# Patient Record
Sex: Male | Born: 1975 | Race: White | Hispanic: Yes | Marital: Married | Smoking: Current some day smoker
Health system: Southern US, Community
[De-identification: ages and names within clinical notes are randomized; demographics above are authoritative.]

---

## 2008-09-13 ENCOUNTER — Emergency Department (HOSPITAL_COMMUNITY): Admission: EM | Admit: 2008-09-13 | Discharge: 2008-09-13 | Payer: Self-pay | Admitting: Emergency Medicine

## 2010-06-22 LAB — URINALYSIS, ROUTINE W REFLEX MICROSCOPIC
Bilirubin Urine: NEGATIVE
Ketones, ur: NEGATIVE mg/dL
Nitrite: NEGATIVE
Protein, ur: NEGATIVE mg/dL
Urobilinogen, UA: 0.2 mg/dL (ref 0.0–1.0)
pH: 5 (ref 5.0–8.0)

## 2010-06-22 LAB — URINE MICROSCOPIC-ADD ON

## 2016-09-05 ENCOUNTER — Emergency Department (HOSPITAL_COMMUNITY)
Admission: EM | Admit: 2016-09-05 | Discharge: 2016-09-05 | Disposition: A | Discharge status: Home / Self Care | Payer: Self-pay | Attending: Emergency Medicine | Admitting: Emergency Medicine

## 2016-09-05 DIAGNOSIS — H66012 Acute suppurative otitis media with spontaneous rupture of ear drum, left ear: Secondary | ICD-10-CM | POA: Insufficient documentation

## 2016-09-05 MED ORDER — AMOXICILLIN 500 MG PO CAPS: 500.0000 mg | ORAL_CAPSULE | Freq: Three times a day (TID) | ORAL | 0 refills | Status: DC

## 2016-09-05 MED ORDER — IBUPROFEN 600 MG PO TABS: 600.0000 mg | ORAL_TABLET | Freq: Three times a day (TID) | ORAL | 0 refills | Status: DC | PRN

## 2016-09-05 MED ORDER — ACETAMINOPHEN 325 MG PO TABS
650.0000 mg | ORAL_TABLET | Freq: Once | ORAL | Status: AC
  Administered 2016-09-05: 650 mg via ORAL
  Filled 2016-09-05: qty 2

## 2016-09-05 NOTE — ED Triage Notes (Signed)
Pain and drainage to left ear x 2 days

## 2016-09-05 NOTE — ED Provider Notes (Signed)
  [facility]-EMERGENCY DEPT Provider Note   CSN: [id number] Arrival date & time: 09/05/16  0141     History   Chief Complaint Chief Complaint  Patient presents with  . Otalgia    HPI Shawn Poole is a 41 y.o. male.  The history is provided by the patient. A language interpreter was used [phone]).  Otalgia  This is a new problem. The current episode started 2 days ago. There is pain in the left ear. The problem occurs constantly. The problem has been gradually worsening. There has been no fever. The pain is moderate. Associated symptoms include ear discharge. Pertinent negatives include no headaches.  denies trauma No h/ surgery to ears No swimming    PMH -none Home Medications    Prior to Admission medications   Medication Sig Start Date End Date Taking? Authorizing Provider  amoxicillin (AMOXIL) 500 MG capsule Take 1 capsule (500 mg total) by mouth 3 (three) times daily. 09/05/16   Zadie RhineWickline, Mirayah Wren, MD  ibuprofen (ADVIL,MOTRIN) 600 MG tablet Take 1 tablet (600 mg total) by mouth every 8 (eight) hours as needed for moderate pain. 09/05/16   Zadie RhineWickline, Karne Ozga, MD    Family History No family history on file.  Social History Social History  Substance Use Topics  . Smoking status: Not on file  . Smokeless tobacco: Not on file  . Alcohol use Not on file     Allergies   Patient has no known allergies.   Review of Systems Review of Systems  HENT: Positive for ear discharge and ear pain.   Neurological: Negative for headaches.     Physical Exam Updated Vital Signs BP 138/90   Pulse 70   Temp 97.8 F (36.6 C) (Oral)   Resp 16   Wt 94.3 kg (208 lb)   SpO2 98%   Physical Exam CONSTITUTIONAL: Well developed/well nourished HEAD: Normocephalic/atraumatic EYES: EOMI/PERRL ENMT: Mucous membranes moist, rgiht TM intact Left TM- ?perforation, fluid noted in canal.  No active bleeding noted NEURO: Pt is awake/alert/appropriate, moves all  extremitiesx4. EXTREMITIES:  full ROM SKIN: warm, color normal PSYCH: no abnormalities of mood noted, alert and oriented to situation   ED Treatments / Results  Labs (all labs ordered are listed, but only abnormal results are displayed) Labs Reviewed - No data to display  EKG  EKG Interpretation None       Radiology No results found.  Procedures Procedures (including critical care time)  Medications Ordered in ED Medications  acetaminophen (TYLENOL) tablet 650 mg (650 mg Oral Given 09/05/16 0412)     Initial Impression / Assessment and Plan / ED Course  I have reviewed the triage vital signs and the nursing notes.     Suspect OM with perforation Pt reports he had bleeding at first, now drainage Will start oral antibiotics He was referred to ENT  Final Clinical Impressions(s) / ED Diagnoses   Final diagnoses:  Acute suppurative otitis media of left ear with spontaneous rupture of tympanic membrane, recurrence not specified    New Prescriptions Discharge Medication List as of 09/05/2016  5:37 AM    START taking these medications   Details  amoxicillin (AMOXIL) 500 MG capsule Take 1 capsule (500 mg total) by mouth 3 (three) times daily., Starting Sat 09/05/2016, Print         Zadie RhineWickline, Jahking Lesser, MD 09/05/16 [phone]

## 2020-09-02 ENCOUNTER — Emergency Department (HOSPITAL_COMMUNITY)
Admission: EM | Admit: 2020-09-02 | Discharge: 2020-09-02 | Disposition: A | Discharge status: Home / Self Care | Payer: Self-pay | Attending: Emergency Medicine | Admitting: Emergency Medicine

## 2020-09-02 ENCOUNTER — Encounter (HOSPITAL_COMMUNITY): Payer: Self-pay | Admitting: *Deleted

## 2020-09-02 ENCOUNTER — Other Ambulatory Visit: Payer: Self-pay

## 2020-09-02 ENCOUNTER — Emergency Department (HOSPITAL_COMMUNITY): Payer: Self-pay

## 2020-09-02 DIAGNOSIS — S82832A Other fracture of upper and lower end of left fibula, initial encounter for closed fracture: Secondary | ICD-10-CM | POA: Insufficient documentation

## 2020-09-02 DIAGNOSIS — Y92009 Unspecified place in unspecified non-institutional (private) residence as the place of occurrence of the external cause: Secondary | ICD-10-CM | POA: Insufficient documentation

## 2020-09-02 DIAGNOSIS — W010XXA Fall on same level from slipping, tripping and stumbling without subsequent striking against object, initial encounter: Secondary | ICD-10-CM | POA: Insufficient documentation

## 2020-09-02 DIAGNOSIS — S8252XA Displaced fracture of medial malleolus of left tibia, initial encounter for closed fracture: Secondary | ICD-10-CM | POA: Insufficient documentation

## 2020-09-02 MED ORDER — OXYCODONE-ACETAMINOPHEN 5-325 MG PO TABS
1.0000 | ORAL_TABLET | Freq: Once | ORAL | Status: AC
  Administered 2020-09-02: 1 via ORAL
  Filled 2020-09-02: qty 1

## 2020-09-02 MED ORDER — HYDROCODONE-ACETAMINOPHEN 5-325 MG PO TABS: 1.0000 | ORAL_TABLET | Freq: Four times a day (QID) | ORAL | 0 refills | Status: DC | PRN

## 2020-09-02 NOTE — ED Notes (Signed)
Pt instructed how to use crutches using spanish interpreter; pt verbalized understanding and was able to demonstrate how to use properly; pt taken out via wheelchair

## 2020-09-02 NOTE — ED Notes (Signed)
Patient off unit to xray at this time

## 2020-09-02 NOTE — ED Provider Notes (Signed)
[facility] EMERGENCY DEPARTMENT Provider Note   CSN: [id number] Arrival date & time: 09/02/20  1544     History No chief complaint on file.   Shawn Poole is a 45 y.o. male who presents for evaluation of pain, swelling in the left lower extremity after mechanical fall that occurred approximately 3 days ago.  He reports he was at his home when he tripped and had an inversion injury of his left ankle.  Since then he has had pain, swelling, bruising to the area.  He reports difficulty ambulating or bearing weight.  He has been taking Motrin at home with minimal improvement.  He denies any numbness/weakness.  The history is provided by the patient.      History reviewed. No pertinent past medical history.  There are no problems to display for this patient.   History reviewed. No pertinent surgical history.     No family history on file.     Home Medications Prior to Admission medications   Medication Sig Start Date End Date Taking? Authorizing Provider  HYDROcodone-acetaminophen (NORCO/VICODIN) 5-325 MG tablet Take 1 tablet by mouth every 6 (six) hours as needed. 09/02/20  Yes Graciella Freer A, PA-C  amoxicillin (AMOXIL) 500 MG capsule Take 1 capsule (500 mg total) by mouth 3 (three) times daily. 09/05/16   Zadie Rhine, MD  ibuprofen (ADVIL,MOTRIN) 600 MG tablet Take 1 tablet (600 mg total) by mouth every 8 (eight) hours as needed for moderate pain. 09/05/16   Zadie Rhine, MD    Allergies    Patient has no known allergies.  Review of Systems   Review of Systems  Constitutional:  Negative for fever.  Musculoskeletal:        LLE pain and swelling  Neurological:  Negative for weakness, numbness and headaches.  All other systems reviewed and are negative.  Physical Exam Updated Vital Signs BP (!) 172/110   Pulse 69   Temp 98.5 F (36.9 C) (Oral)   Resp 18   SpO2 100%   Physical Exam Vitals and nursing note reviewed.  Constitutional:       Appearance: He is well-developed.  HENT:     Head: Normocephalic and atraumatic.  Eyes:     General: No scleral icterus.       Right eye: No discharge.        Left eye: No discharge.     Conjunctiva/sclera: Conjunctivae normal.  Cardiovascular:     Pulses:          Dorsalis pedis pulses are 2+ on the right side and 2+ on the left side.  Pulmonary:     Effort: Pulmonary effort is normal.  Musculoskeletal:     Comments: Diffuse soft tissue Noted around the left foot, left ankle, left tib-fib that extends in mid tib-fib area.  There is some overlying ecchymosis and abrasion noted to the ankle.  No deep wound or laceration.  Limited range of motion secondary to pain.  He can fully wiggle all 5 toes.  Diffuse tenderness to mid to the distal and mid tib-fib area.  No tenderness in the proximal tib-fib.  Skin:    General: Skin is warm and dry.     Comments: Good distal cap refill. LLE is not dusky in appearance or cool to touch. Small abrasion noted to the dorsal ankle. No surrounding warmth. No deep wound or laceration.   Neurological:     Mental Status: He is alert.     Comments: Sensation intact along major  nerve distributions of LLE.  Psychiatric:        Speech: Speech normal.        Behavior: Behavior normal.    ED Results / Procedures / Treatments   Labs (all labs ordered are listed, but only abnormal results are displayed) Labs Reviewed - No data to display  EKG None  Radiology DG Tibia/Fibula Left  Result Date: 09/02/2020 CLINICAL DATA:  45 year old male with fall and trauma to the left lower extremity. EXAM: LEFT TIBIA AND FIBULA - 2 VIEW; LEFT FOOT - COMPLETE 3+ VIEW; LEFT ANKLE COMPLETE - 3+ VIEW COMPARISON:  None. FINDINGS: There is a mildly displaced oblique fracture of the distal fibula. There is a displaced fracture of the tip of the medial malleolus. There is approximately 8 mm lateral subluxation of the ankle mortise. No other acute fracture. The soft tissue swelling of  the ankle. No radiopaque foreign object or soft tissue gas. IMPRESSION: Mildly displaced oblique fracture of the distal fibula and displaced fracture of the tip of the medial malleolus with approximately 8 mm lateral subluxation of the ankle mortise. Electronically Signed   By: Elgie Collard M.D.   On: 09/02/2020 18:30   DG Ankle Complete Left  Result Date: 09/02/2020 CLINICAL DATA:  45 year old male with fall and trauma to the left lower extremity. EXAM: LEFT TIBIA AND FIBULA - 2 VIEW; LEFT FOOT - COMPLETE 3+ VIEW; LEFT ANKLE COMPLETE - 3+ VIEW COMPARISON:  None. FINDINGS: There is a mildly displaced oblique fracture of the distal fibula. There is a displaced fracture of the tip of the medial malleolus. There is approximately 8 mm lateral subluxation of the ankle mortise. No other acute fracture. The soft tissue swelling of the ankle. No radiopaque foreign object or soft tissue gas. IMPRESSION: Mildly displaced oblique fracture of the distal fibula and displaced fracture of the tip of the medial malleolus with approximately 8 mm lateral subluxation of the ankle mortise. Electronically Signed   By: Elgie Collard M.D.   On: 09/02/2020 18:30   DG Foot Complete Left  Result Date: 09/02/2020 CLINICAL DATA:  45 year old male with fall and trauma to the left lower extremity. EXAM: LEFT TIBIA AND FIBULA - 2 VIEW; LEFT FOOT - COMPLETE 3+ VIEW; LEFT ANKLE COMPLETE - 3+ VIEW COMPARISON:  None. FINDINGS: There is a mildly displaced oblique fracture of the distal fibula. There is a displaced fracture of the tip of the medial malleolus. There is approximately 8 mm lateral subluxation of the ankle mortise. No other acute fracture. The soft tissue swelling of the ankle. No radiopaque foreign object or soft tissue gas. IMPRESSION: Mildly displaced oblique fracture of the distal fibula and displaced fracture of the tip of the medial malleolus with approximately 8 mm lateral subluxation of the ankle mortise.  Electronically Signed   By: Elgie Collard M.D.   On: 09/02/2020 18:30    Procedures .Splint Application  Date/Time: 09/02/2020 7:03 PM Performed by: Maxwell Caul, PA-C Authorized by: Maxwell Caul, PA-C   Consent:    Consent obtained:  Verbal   Consent given by:  Patient   Risks, benefits, and alternatives were discussed: yes     Risks discussed:  Discoloration, numbness, pain and swelling   Alternatives discussed:  No treatment Universal protocol:    Procedure explained and questions answered to patient or proxy's satisfaction: yes     Relevant documents present and verified: yes     Test results available: yes     Imaging studies  available: yes     Required blood products, implants, devices, and special equipment available: yes     Site/side marked: yes     Immediately prior to procedure a time out was called: yes     Patient identity confirmed:  Verbally with patient Pre-procedure details:    Distal neurologic exam:  Normal   Distal perfusion: brisk capillary refill   Procedure details:    Location:  Ankle   Ankle location:  L ankle   Lower extremity cast type: Posterior with stirrup.   Supplies:  Fiberglass   Attestation: Splint applied and adjusted personally by me   Post-procedure details:    Distal neurologic exam:  Normal   Distal perfusion: brisk capillary refill     Procedure completion:  Tolerated   Medications Ordered in ED Medications  oxyCODONE-acetaminophen (PERCOCET/ROXICET) 5-325 MG per tablet 1 tablet (1 tablet Oral Given 09/02/20 1806)    ED Course  I have reviewed the triage vital signs and the nursing notes.  Pertinent labs & imaging results that were available during my care of the patient were reviewed by me and considered in my medical decision making (see chart for details).    MDM Rules/Calculators/A&P                          45 y.o. M who presents for evaluation of LLE pain and swelling that began 3 days ago after twisting his  ankle. Patient reports that since then he has had pain and difficulty ambulating bearing weight.  He states the swelling is gotten worse.  No numbness.  On initial arrival, he is afebrile, nontoxic-appearing.  Vital signs are stable.  On exam, he has diffuse swelling and ecchymosis around the left ankle and left tib-fib area. Compartments are soft. Not concerning for compartment syndrome.  He has a small abrasion noted to the anterior aspect of the ankle.  No deep wound, laceration noted concern for open fracture.  Concern for fracture versus sprain.  We will plan for x-rays.  X-rays show mildly displaced oblique fracture of the distal fibula and displaced fracture of the tip of the medial malleolus with approximately 8 mm follow-up subluxation of the ankle mortise.  X-ray shows mildly displaced oblique fracture of the distal fibula displaced fracture of the tip of the medial malleolus with approximately a millimeter lateral subluxation of the ankle moretise.   Discussed with Dr. Cathrine Muster (ortho).  He recommends patient be put in a posterior stirrup splint with the foot at about 90 degrees.  He will plan to see patient in outpatient consultation.  Using a language interpreter, discussed results with patient.  Splint applied as documented above.  Patient tolerated procedure well.  Patient with good distal sensation, cap refill after splint placement.  Patient reviewed on PMP.  Will give short course of pain medication for acute/breakthrough pain.  Patient instructed follow-up with orthopedics. At this time, patient exhibits no emergent life-threatening condition that require further evaluation in ED. Patient had ample opportunity for questions and discussion. All patient's questions were answered with full understanding. Strict return precautions discussed. Patient expresses understanding and agreement to plan.   Portions of this note were generated with Scientist, clinical (histocompatibility and immunogenetics). Dictation errors may occur  despite best attempts at proofreading.  Final Clinical Impression(s) / ED Diagnoses Final diagnoses:  Closed fracture of distal end of left fibula, unspecified fracture morphology, initial encounter  Displaced fracture of medial malleolus of left tibia, initial encounter  for closed fracture    Rx / DC Orders ED Discharge Orders          Ordered    HYDROcodone-acetaminophen (NORCO/VICODIN) 5-325 MG tablet  Every 6 hours PRN        09/02/20 1909             Rosana HoesLayden, Lyan Holck A, PA-C 09/02/20 1917    Pricilla LovelessGoldston, Scott, MD 09/05/20 [phone]

## 2020-09-02 NOTE — ED Triage Notes (Signed)
Left foot and ankle pain onset 3 days after

## 2020-09-02 NOTE — Discharge Instructions (Signed)
You can take Tylenol or Ibuprofen as directed for pain. You can alternate Tylenol and Ibuprofen every 4 hours. If you take Tylenol at 1pm, then you can take Ibuprofen at 5pm. Then you can take Tylenol again at 9pm.   Take pain medications as directed for break through pain. Do not drive or operate machinery while taking this medication.   You should not get your splint wet.  Keep this on until you follow-up with orthopedics.  Use crutches and do not put any weight on the foot.  Elevate the foot to help with swelling.  Follow-up with referred orthopedic doctor.  Return emergency department any worsening pain, discoloration of the toes, numbness or any other worsening concerning symptoms.   Puede tomar Tylenol o Ibuprofen segn las indicaciones para el dolor. Puede alternar Tylenol e Ibuprofeno cada 4 horas. Si toma Tylenol a la 1 p. m., puede tomar ibuprofeno a las 5 p. m. Luego puede volver a tomar Tylenol a las 9:00 p. m.  Tome los analgsicos segn las indicaciones para el dolor irruptivo. No conduzca ni maneje maquinaria mientras toma este medicamento.  No debe mojarse la frula. Contine as hasta que haga un seguimiento con ortopedia. Use muletas y no ponga ningn peso [employer].  Eleve el pie para ayudar con la hinchazn.  Seguimiento con mdico ortopdico referido.  Devuelva al departamento de emergencias cualquier empeoramiento del dolor, decoloracin de los dedos de los pies, entumecimiento o cualquier otro empeoramiento de los sntomas relacionados.

## 2020-09-03 ENCOUNTER — Ambulatory Visit: Payer: Self-pay | Admitting: Orthopedic Surgery

## 2020-09-06 ENCOUNTER — Ambulatory Visit (INDEPENDENT_AMBULATORY_CARE_PROVIDER_SITE_OTHER): Payer: Self-pay | Admitting: Orthopedic Surgery

## 2020-09-06 ENCOUNTER — Encounter: Payer: Self-pay | Admitting: Orthopedic Surgery

## 2020-09-06 ENCOUNTER — Other Ambulatory Visit: Payer: Self-pay

## 2020-09-06 ENCOUNTER — Ambulatory Visit: Payer: Self-pay | Admitting: Orthopedic Surgery

## 2020-09-06 ENCOUNTER — Telehealth: Payer: Self-pay | Admitting: Orthopedic Surgery

## 2020-09-06 VITALS — Ht 72.0 in | Wt 208.0 lb

## 2020-09-06 DIAGNOSIS — S82892A Other fracture of left lower leg, initial encounter for closed fracture: Secondary | ICD-10-CM

## 2020-09-06 MED ORDER — HYDROCODONE-ACETAMINOPHEN 5-325 MG PO TABS: 1.0000 | ORAL_TABLET | Freq: Four times a day (QID) | ORAL | 0 refills | Status: DC | PRN

## 2020-09-06 NOTE — H&P (View-Only) (Signed)
New Patient Visit  Assessment: Shawn Poole is a 45 y.o. male with the following:  Left ankle fracture; functional bimalleolar equivalent  Plan: With the assistance of a spanish interpreter, reviewed radiographs and demonstrated the injury that he sustained.  He has a displaced distal tibia fracture with medial clear space widening.  There does not appear to be a medial malleolar fracture, although there could be an avulsion fracture.  Nonetheless, this represents an unstable injury and he will benefit from operative fixation, in order to restore form and function.  Risks and benefits of surgery, including, but not limited to infection, bleeding, persistent pain, damage to surrounding structures, need for further surgery, malunion, nonunion and more severe complications associated with anesthesia were discussed.  All questions have been answered and they have elected to proceed with surgery.    He is at an increased risk for poor healing because he smokes cigarettes.  He is aware that this can impede bone healing.   He was given an updated prescription for hydrocodone.  He is to keep the splint clean, dry and intact.  Non weightbearing until surgery.   Surgical Plan  Procedure:  Operative fixation of left ankle fracture, September 12, 2020 Disposition: Ambulatory Anesthesia: Choice; consider block and sedation Medical Comorbidities: Healthy; smokes cigarettes.  Notes: Spanish speaking; requires interpreter.  His brother can communicate in English    Follow-up: Return for After surgery; 2 weeks postop (DOS 09/12/20).  Subjective:  Chief Complaint  Patient presents with   Ankle Injury    Lt ankle fx DOI 08/31/20    History of Present Illness: Shawn Poole is a 45 y.o. male who presents for evaluation of left ankle pain.  Almost a week ago he twisted his ankle in his home. Initially, he tried to walk on it, but the pain was too severe.  After a couple of days, he presented  to the ED for evaluation.  He was noted to have a left ankle fracture, was splinted and sent to clinic.  He has remained NWB with crutches.  He has been taking hydrocodone for pain.  No other injuries.    Review of Systems: No fevers or chills No numbness or tingling No chest pain No shortness of breath No bowel or bladder dysfunction No GI distress No headaches   Medical History:  History reviewed. No pertinent past medical history.  History reviewed. No pertinent surgical history.  History reviewed. No pertinent family history. Social History   Tobacco Use   Smoking status: Some Days    Pack years: 0.00    Types: Cigarettes   Smokeless tobacco: Never    No Known Allergies  Current Meds  Medication Sig   HYDROcodone-acetaminophen (NORCO/VICODIN) 5-325 MG tablet Take 1 tablet by mouth every 6 (six) hours as needed.    Objective: Ht 6' (1.829 m)   Wt 208 lb (94.3 kg)   BMI 28.21 kg/m   Physical Exam:  General: Alert and oriented, no acute distress Gait: NWB, uses crutches to assist with ambulation  Left leg in a short leg splint.  It is clean, dry and intact.  Bruising proximal to splint.  No skin break down.  Some swelling to dorsum of his foot.  Active motion intact to EHL. Toes are warm and well perfused.  Sensation intact to exposed toes.     IMAGING: I personally ordered and reviewed the following images  XR of the left ankle demonstrates distal fibula fracture with displacement and small avulsion  fracture off medial malleolus.  There is medial clear space widening.  Injury consistent with a bimalleolar ankle fracture.    New Medications:  No orders of the defined types were placed in this encounter.     Oliver Barre, MD  09/06/2020 11:18 AM

## 2020-09-06 NOTE — Telephone Encounter (Signed)
09/06/20 TRANSLATER SPOKE WITH [place]  BROTHER.  HE SAID HE WILL GET WITH [place]  AND HUNG UP.    TRANSLATER LEFT.

## 2020-09-06 NOTE — Progress Notes (Signed)
New Patient Visit  Assessment: Shawn Poole is a 45 y.o. male with the following:  Left ankle fracture; functional bimalleolar equivalent  Plan: With the assistance of a spanish interpreter, reviewed radiographs and demonstrated the injury that he sustained.  He has a displaced distal tibia fracture with medial clear space widening.  There does not appear to be a medial malleolar fracture, although there could be an avulsion fracture.  Nonetheless, this represents an unstable injury and he will benefit from operative fixation, in order to restore form and function.  Risks and benefits of surgery, including, but not limited to infection, bleeding, persistent pain, damage to surrounding structures, need for further surgery, malunion, nonunion and more severe complications associated with anesthesia were discussed.  All questions have been answered and they have elected to proceed with surgery.    He is at an increased risk for poor healing because he smokes cigarettes.  He is aware that this can impede bone healing.   He was given an updated prescription for hydrocodone.  He is to keep the splint clean, dry and intact.  Non weightbearing until surgery.   Surgical Plan  Procedure:  Operative fixation of left ankle fracture, September 12, 2020 Disposition: Ambulatory Anesthesia: Choice; consider block and sedation Medical Comorbidities: Healthy; smokes cigarettes.  Notes: Spanish speaking; requires interpreter.  His brother can communicate in English    Follow-up: Return for After surgery; 2 weeks postop (DOS 09/12/20).  Subjective:  Chief Complaint  Patient presents with   Ankle Injury    Lt ankle fx DOI 08/31/20    History of Present Illness: Shawn Poole is a 45 y.o. male who presents for evaluation of left ankle pain.  Almost a week ago he twisted his ankle in his home. Initially, he tried to walk on it, but the pain was too severe.  After a couple of days, he presented  to the ED for evaluation.  He was noted to have a left ankle fracture, was splinted and sent to clinic.  He has remained NWB with crutches.  He has been taking hydrocodone for pain.  No other injuries.    Review of Systems: No fevers or chills No numbness or tingling No chest pain No shortness of breath No bowel or bladder dysfunction No GI distress No headaches   Medical History:  History reviewed. No pertinent past medical history.  History reviewed. No pertinent surgical history.  History reviewed. No pertinent family history. Social History   Tobacco Use   Smoking status: Some Days    Pack years: 0.00    Types: Cigarettes   Smokeless tobacco: Never    No Known Allergies  Current Meds  Medication Sig   HYDROcodone-acetaminophen (NORCO/VICODIN) 5-325 MG tablet Take 1 tablet by mouth every 6 (six) hours as needed.    Objective: Ht 6' (1.829 m)   Wt 208 lb (94.3 kg)   BMI 28.21 kg/m   Physical Exam:  General: Alert and oriented, no acute distress Gait: NWB, uses crutches to assist with ambulation  Left leg in a short leg splint.  It is clean, dry and intact.  Bruising proximal to splint.  No skin break down.  Some swelling to dorsum of his foot.  Active motion intact to EHL. Toes are warm and well perfused.  Sensation intact to exposed toes.     IMAGING: I personally ordered and reviewed the following images  XR of the left ankle demonstrates distal fibula fracture with displacement and small avulsion  fracture off medial malleolus.  There is medial clear space widening.  Injury consistent with a bimalleolar ankle fracture.    New Medications:  No orders of the defined types were placed in this encounter.     Nastashia Gallo A Arlyn Bumpus, MD  09/06/2020 11:18 AM    

## 2020-09-06 NOTE — Patient Instructions (Addendum)
Instrucciones Para Antes de la Ciruga   Su ciruga est programada para-(your procedure is scheduled on)  09/12/2020 @ 0800.   [address] [place] - (enter)    Por favor llame al [phone] si tiene algn problema en la maana de la ciruga. (please call if you have any problems the morning of surgery.)                  Recuerde: (Remember)   No coma alimentos ni tome lquidos, incluyendo agua, despus de la medianoche del  (Do not eat food or drink liquids including water after midnight on 09/11/2020.   Tome estas medicinas en la maana de la ciruga con un SORBITO de agua (take these meds the morning of surgery with a SIP of water)   Hydrocodone (if needed)   Puede cepillarse los dientes en la maana de la ciruga. (you may brush your teeth the morning of surgery)   No use joyas, maquillaje de ojos, lpiz labial, crema para el cuerpo o esmalte de uas oscuro. (Do not wear jewelry, eye makeup, lipstick, body lotion, or dark fingernail polish)   No puede usar desodorante. (you may wear deodorant)   Si va a ser ingresado despues de la ciruga, deje la maleta en el carro hasta que se le haya asignado una habitacin. (If you are to be admitted after surgery, leave suitcase in car until your room has been assigned.)   A los pacientes que se les d de alta el mismo da no se les permitir manejar a casa.  (Patients discharged on the day of surgery will not be allowed to drive home)   Use ropa suelta y cmoda de regreso a casa. (wear loose comfortable clothes for ride home)    ______________________________________                                             Instrucciones Para Antes de la Ciruga   Su ciruga est programada para-(your procedure is scheduled on) 09/12/2020 @ 0800.   [address] [place] - (enter)    Por favor llame al [phone] si tiene algn problema en la maana de la ciruga. (please call if you have any  problems the morning of surgery.)                  Recuerde: (Remember)   No coma alimentos ni tome lquidos, incluyendo agua, despus de la medianoche del  (Do not eat food or drink liquids including water after midnight on  09/11/2020.   Tome estas medicinas en la maana de la ciruga con un SORBITO de agua (take these meds the morning of surgery with a SIP of water) Hydrocodone (if needed)______   [employer] dientes en la maana de la [place]. (you may brush your teeth the morning of surgery)   No use joyas, maquillaje de ojos, lpiz labial, crema para el cuerpo o esmalte de uas oscuro. (Do not wear jewelry, eye makeup, lipstick, body lotion, or dark fingernail polish)   No puede usar desodorante. (you may wear deodorant)   Si va  a ser ingresado despues de la ciruga, deje la [employer] en el carro [employer] se le haya asignado una habitacin. (If you are to be admitted after surgery, leave suitcase in car until your room has been assigned.)   A los pacientes que se les d de alta el mismo da no se les permitir manejar a casa.  (Patients discharged on the day of surgery will not be allowed to drive home)   Use ropa suelta y cmoda de regreso a casa. (wear loose comfortable clothes for ride home)    ___Anestesia general en adultos, cuidados posteriores General Anesthesia, Adult, Care After Esta hoja le brinda informacin sobre cmo cuidarse despus del procedimiento. El mdico tambin podr darle instrucciones ms especficas. Comunquese con elmdico si tiene problemas o preguntas. Qu puedo esperar despus del procedimiento? Luego del procedimiento, son comunes los siguiente efectos secundarios: Dolor o Associate Professor en el lugar de la va intravenosa (i.v.). Nuseas. Vmitos. El dolor de Advertising copywriter. Dificultad para concentrarte. Sentir fro o [employer]. Sensacin de debilidad o cansancio. Somnolencia y Management consultant. Malestar y Tourist information centre manager. Estos efectos secundarios  pueden afectar partes del cuerpo que no estuvieron involucradas en la ciruga. Siga estas instrucciones en su casa: Durante el perodo de [employer] le haya indicado el mdico:  Descanse. No participe en actividades que impliquen posibles cadas o lesiones. No conduzca ni opere maquinaria. No beba alcohol. No tome somnferos ni medicamentos que causen somnolencia. No tome decisiones trascendentes ni firme documentos importantes. No cuide a nios por su cuenta.  Comida y bebida Siga las indicaciones del mdico respecto de las restricciones de comidas o bebidas. Cuando [employer], comience a comer cantidades pequeas de alimentos que sean blandos y fciles de Location manager (livianos), como una tostada. Retome su dieta habitual de forma gradual. Beber suficiente lquido como para mantener la orina de color amarillo plido. Si vomita, rehidrtese tomando agua, jugo o caldo transparente. Instrucciones generales Si tiene apnea del sueo, la [place] y ciertos medicamentos pueden elevar su riesgo de tener problemas respiratorios. Siga las instrucciones del mdico respecto al uso del dispositivo para dormir: Siempre que duerma, incluso durante las siestas que tome en [employer]. Mientras tome analgsicos recetados, medicamentos para dormir o medicamentos que producen somnolencia. Pida a un adulto responsable que se quede con usted durante el tiempo que se le indique. Es importante tener a alguien que lo ayude a cuidarse hasta que est despierto y Research officer, political party. Retome sus actividades normales segn lo indicado por el mdico. Pregntele al mdico qu actividades son seguras para usted. Use los medicamentos de venta libre y los recetados solamente como se lo haya indicado el mdico. Si fuma, no lo haga sin supervisin. Concurra a todas las visitas de [address] se lo haya indicado el mdico. Esto es importante. Comunquese con un mdico si: Tiene nuseas o vmitos que no mejoran con medicamentos. No  puede comer ni beber sin vomitar. Su dolor no se alivia con medicamentos. No puede orinar. Tiene una erupcin cutnea. Tiene fiebre. Presenta enrojecimiento alrededor del lugar de la va intravenosa (i.v.) que empeora. Solicite ayuda de inmediato si: Tienes dificultad para respirar. Sientes dolor en el pecho. Observa sangre en la orina o heces, o vomita sangre. Resumen Despus del procedimiento, es comn tener dolor de garganta y nuseas. Tambin es comn sentirse cansado. Pida a un adulto responsable que se quede con usted durante el tiempo que se le indique. Es importante tener a alguien que lo ayude a cuidarse hasta que est despierto y  consciente. Cuando [employer], comience a comer cantidades pequeas de alimentos que sean blandos y fciles de Location manager (livianos), como una tostada. Retome su dieta habitual de forma gradual. Beber suficiente lquido como para mantener la orina de color amarillo plido. Retome sus actividades normales segn lo indicado por el mdico. Pregntele al mdico qu actividades son seguras para usted. Esta informacin no tiene Theme park manager el consejo del mdico. Asegresede hacerle al mdico cualquier pregunta que tenga. Document Revised: 08/24/2019 Document Reviewed: 08/24/2019 Elsevier Patient Education  2022 Elsevier Inc.   Fractura desplazada de malolo interno o posterior en el tobillo tratada conRAFI (reduccin abierta con fijacin interna), cuidados posteriores Displaced Medial or Posterior Malleolar Ankle Fracture Treated With ORIF, CareAfter Esta hoja le brinda informacin sobre cmo cuidarse despus del procedimiento. Su mdico tambin podr darle instrucciones ms especficas. Comunquese con elmdico si tiene problemas o preguntas. Qu puedo esperar despus del procedimiento? Despus del procedimiento, es normal tener los siguientes sntomas: Dolor. Hinchazn. La incisin segrega una pequea cantidad de lquido. Siga estas instrucciones  en su casa: Si tiene una bota: Use la bota como se lo haya indicado el mdico. Qutesela solamente como se lo haya indicado el mdico. Afloje la bota si siente hormigueo en los dedos de los pies, si se le entumecen, o se le enfran y se tornan de Research officer, trade union. Mantenga la bota limpia y seca. Si tiene un yeso: No introduzca nada dentro del yeso para rascarse la piel. Esto puede aumentar el riesgo de contraer una infeccin. Controle la piel alrededor del [employer]. Informe al mdico acerca de cualquier inquietud. Puede aplicar una locin en la piel seca alrededor de los bordes del yeso. No aplique locin en la piel por debajo del yeso. Mantenga el yeso seco y limpio. Baos No tome baos de inmersin, no nade ni use el jacuzzi hasta que el mdico lo autorice. Pregntele al mdico si puede ducharse. Si el yeso o la bota no son impermeables: No deje que se mojen. Cbralos con un envoltorio hermtico cuando tome un bao de inmersin o una ducha. Mantenga la venda (vendaje) seca hasta que el mdico le diga que se la puede quitar. Cuidado de la incisin  Siga las instrucciones del mdico acerca del cuidado de la incisin. Asegrese de hacer lo siguiente: Lvese las manos con agua y jabn durante al menos 20 segundos antes y despus de Multimedia programmer los vendajes. Use desinfectante para manos si no dispone de [place] y [place]. Cambie el vendaje como se lo haya indicado el mdico. No retire los puntos (suturas), la goma para cerrar la piel o las tiras [place]. Es posible que estos cierres cutneos deban quedar puestos en la piel durante 2 semanas o ms tiempo. Si los bordes de las tiras [address] empiezan a despegarse y Scientific laboratory technician, puede recortar los que estn sueltos. No retire las tiras [employer] por completo a menos que el mdico se lo indique. Controle la zona de la incisin todos los [address]  [address] para detectar signos de infeccin. Est atento a los siguientes signos: Enrojecimiento. Ms dolor o  hinchazn. Mayor presencia de [place] o lquido. Calor. Pus o mal olor.  Control del dolor, la rigidez y la hinchazn  Si se lo indican, aplique hielo sobre la zona afectada. Para hacer esto: Si tiene una bota desmontable, qutesela como se lo haya indicado el mdico. Ponga el hielo en una bolsa plstica. Coloque una toalla entre la piel y la bolsa de hielo o el yeso y la bolsa de  hielo. Aplique el hielo durante 20 minutos, 2 o 3 veces por da. Retire el hielo si la piel se pone de color rojo brillante. Esto es Intelmuy importante. Si no puede sentir dolor, calor o fro, tiene un mayor riesgo de que se dae la zona. Mueva los dedos del pie con frecuencia para reducir la rigidez y la hinchazn. Cuando est sentado o acostado, levante (eleve) la zona lesionada por encima del nivel del corazn. Para hacerlo, intente colocando algunas almohadas debajo de la pierna y Hindmanel tobillo.  Electronic Data Systemsctividad Haga los ejercicios como se lo haya indicado el mdico o el fisioterapeuta. No apoye (soporte) el peso del cuerpo Intelsobre la extremidad Dillard'slesionada hasta que lo autorice el mdico. Respete las restricciones para soportar peso segn lo indicado. Use muletas u otros dispositivos como ayuda para movilizarse (dispositivos de asistencia) segn lo indicado por el mdico. Pregntele al mdico cundo puede volver a conducir si tiene una bota o un yeso en el pie. Retome sus actividades normales segn lo indicado por el mdico. Pregntele al mdico qu actividades son seguras para usted. Instrucciones generales No ejerza presin en ninguna parte del yeso hasta que se haya endurecido por completo. Esto puede tardar varias horas. No consuma ningn producto que contenga nicotina o tabaco, como cigarrillos, cigarrillos electrnicos y tabaco de Theatre managermascar. Estos productos pueden retrasar la cicatrizacin del hueso despus de la [place]. Si necesita ayuda para dejar de fumar, consulte al American Expressmdico. Use los medicamentos de venta libre y los  recetados solamente como se lo haya indicado el mdico. Pregntele al mdico si el medicamento recetado: Hace necesario que evite conducir o usar [place]aymaquinaria. Puede causarle estreimiento. Es posible que tenga que tomar estas medidas para prevenir o tratar el estreimiento: Product managerBeber suficiente lquido como para Pharmacologistmantener la orina de color amarillo plido. Usar medicamentos recetados o de Sales promotion account executiveventa libre. Consumir alimentos ricos en fibra, como frijoles, cereales integrales, y frutas y verduras frescas. Limitar el consumo de alimentos ricos en grasa y azcares procesados, como los alimentos fritos o dulces. Cumpla con todas las visitas de seguimiento. Esto es importante. Comunquese con un mdico si: Tiene fiebre. El medicamento no IT trainerle calma el dolor. Tiene enrojecimiento alrededor de la incisin. Tiene mayor hinchazn o dolor alrededor de la incisin. Tiene sangre o ms lquido que salen de la incisin o que gotean a travs del yeso. Las incisiones se sienten calientes al tacto. Tiene secrecin de pus o percibe un olor ftido que emanan de la incisin o del vendaje. Solicite ayuda de inmediato si: Los bordes de la incisin se abren despus de Advertising account plannerquitar las suturas o las grapas. Siente dolor en el pecho. Tiene dificultad para respirar. Tiene entumecimiento u hormigueo en el pie o la pierna. El pie est fro, plido o de Edison Internationalcolor azul. Tiene dolor o hinchazn en la pantorrilla. Estos sntomas pueden representar un problema grave que constituye Radio broadcast assistantuna emergencia. No espere a ver si los sntomas desaparecen. Solicite atencin mdica de inmediato. Comunquese con el servicio de emergencias de su localidad (911 en los [place]). No conduzca por sus propios medios OfficeMax Incorporatedhasta el hospital. Resumen Despus del procedimiento, es comn tener algo de dolor e hinchazn. Si la bota o el yeso no son impermeables, no deje que se mojen. Comunquese con el mdico si tiene hinchazn o dolor intenso, o tiene sangre o ms  lquidos que salen de la incisin o que gotean a travs del yeso. Solicite ayuda de inmediato si tiene entumecimiento u hormigueo en el pie o la pierna, o  si su pie se torna fro, plido, o de color azul. Esta informacin no tiene Theme park manager el consejo del mdico. Asegresede hacerle al mdico cualquier pregunta que tenga. Document Revised: 08/30/2019 Document Reviewed: 08/30/2019 Elsevier Patient Education  2022 Elsevier Inc.   Only use CHG cloths as told by your health care provider, and follow the instructions on the label. Use the CHG cloth on clean, dry skin. Do not use the CHG cloth on your head o face unless your health care provider tells you to. When washing with the CHG cloth: Avoid your genital area. Avoid any areas of skin that have broken skin, cuts, or scrapes. Before surgery Follow these steps when using a CHG cloth to clean before surgery (unless your health care provider gives you different instructions): Using the CHG cloth, vigorously scrub the part of your body where you will be having surgery. Scrub using a back-and-forth motion for 3 minutes. The area on your body should be completely wet with CHG when you are done scrubbing. Do not rinse. Discard the cloth and let the area air-dry. Do not put any substances on the area afterward, such as powder, lotion, or perfume. Put on clean clothes or pajamas. If it is the night before your surgery, sleep in clean sheets.  For general bathing Follow these steps when using CHG cloths for general bathing (unless your health care provider gives you different instructions). Use a separate CHG cloth for each area of your body. Make sure you wash between any folds of skin and between your fingers and toes. Wash your body in the following order, switching to a new cloth after each step: The front of your neck, shoulders, and chest. Both of your arms, under your arms, and your hands. Your stomach and groin area, avoiding the  genitals. Your right leg and foot. Your left leg and foot. The back of your neck, your back, and your buttocks. Do not rinse. Discard the cloth and let the area air-dry. Do not put any substances on your body afterward--such as powder, lotion, or perfume--unless you are told to do so by your health care provider. Only use lotions that are recommended by the manufacturer. Put on clean clothes or pajamas. Contact a health care provider if: Your skin gets irritated after scrubbing. You have questions about using your solution or cloth. Get help right away if: Your eyes become very red or swollen. Your eyes itch badly. Your skin itches badly and is red or swollen. Your hearing changes. You have trouble seeing. You have swelling or tingling in your mouth or throat. You have trouble breathing. You swallow any chlorhexidine. Summary Chlorhexidine gluconate (CHG) is a germ-killing (antiseptic) solution that is used to clean the skin. Cleaning your skin with CHG may help to lower your risk for infection. You may be given CHG to use for bathing. It may be in a bottle or in a prepackaged cloth to use on your skin. Carefully follow your health care provider's instructions and the instructions on the product label. Do not use CHG if you have a chlorhexidine allergy. Contact your health care provider if your skin gets irritated after scrubbing. This information is not intended to replace advice given to you by your health care provider. Make sure you discuss any questions you have with your healthcare provider. Document Revised: 07/14/2019 Document Reviewed: 08/18/2019 Elsevier Patient Education  2022 [employer].

## 2020-09-09 ENCOUNTER — Other Ambulatory Visit: Payer: Self-pay

## 2020-09-09 ENCOUNTER — Encounter (HOSPITAL_COMMUNITY)
Admission: RE | Admit: 2020-09-09 | Discharge: 2020-09-09 | Disposition: A | Discharge status: Home / Self Care | Payer: Self-pay | Source: Ambulatory Visit | Attending: Orthopedic Surgery | Admitting: Orthopedic Surgery

## 2020-09-09 ENCOUNTER — Encounter (HOSPITAL_COMMUNITY): Payer: Self-pay

## 2020-09-09 DIAGNOSIS — Z01812 Encounter for preprocedural laboratory examination: Secondary | ICD-10-CM | POA: Insufficient documentation

## 2020-09-09 LAB — BASIC METABOLIC PANEL
Anion gap: 7 (ref 5–15)
BUN: 17 mg/dL (ref 6–20)
CO2: 26 mmol/L (ref 22–32)
Calcium: 8.8 mg/dL — ABNORMAL LOW (ref 8.9–10.3)
Chloride: 106 mmol/L (ref 98–111)
Creatinine, Ser: 0.71 mg/dL (ref 0.61–1.24)
GFR, Estimated: >60 mL/min (ref >60)
Glucose, Bld: 140 mg/dL — ABNORMAL HIGH (ref 70–99)
Potassium: 3.6 mmol/L (ref 3.5–5.1)
Sodium: 139 mmol/L (ref 135–145)

## 2020-09-09 LAB — CBC
HCT: 47 % (ref 39.0–52.0)
Hemoglobin: 15.9 g/dL (ref 13.0–17.0)
MCH: 32.1 pg (ref 26.0–34.0)
MCHC: 33.8 g/dL (ref 30.0–36.0)
MCV: 94.8 fL (ref 80.0–100.0)
Platelets: 289 10*3/uL (ref 150–400)
RBC: 4.96 MIL/uL (ref 4.22–5.81)
RDW: 12.6 % (ref 11.5–15.5)
WBC: 6.4 10*3/uL (ref 4.0–10.5)
nRBC: 0 % (ref 0.0–0.2)

## 2020-09-11 NOTE — Progress Notes (Signed)
Talked with pt's brother. Instructed him pt needed to be at the hospital at 0615 on 09/12/20 for his procedure. Instructed brother that pt would need someone to stay with him for 24 hrs. Brother voiced understanding.

## 2020-09-12 ENCOUNTER — Encounter (HOSPITAL_COMMUNITY): Payer: Self-pay | Admitting: Orthopedic Surgery

## 2020-09-12 ENCOUNTER — Ambulatory Visit (HOSPITAL_COMMUNITY)
Admission: RE | Admit: 2020-09-12 | Discharge: 2020-09-12 | Disposition: A | Discharge status: Home / Self Care | Payer: Self-pay | Attending: Orthopedic Surgery | Admitting: Orthopedic Surgery

## 2020-09-12 ENCOUNTER — Encounter (HOSPITAL_COMMUNITY)
Admission: RE | Disposition: A | Discharge status: Home / Self Care | Payer: Self-pay | Source: Home / Self Care | Attending: Orthopedic Surgery

## 2020-09-12 ENCOUNTER — Ambulatory Visit (HOSPITAL_COMMUNITY): Payer: Self-pay

## 2020-09-12 ENCOUNTER — Ambulatory Visit (HOSPITAL_COMMUNITY): Payer: Self-pay | Admitting: Anesthesiology

## 2020-09-12 ENCOUNTER — Other Ambulatory Visit: Payer: Self-pay

## 2020-09-12 DIAGNOSIS — Y92009 Unspecified place in unspecified non-institutional (private) residence as the place of occurrence of the external cause: Secondary | ICD-10-CM | POA: Insufficient documentation

## 2020-09-12 DIAGNOSIS — S82892A Other fracture of left lower leg, initial encounter for closed fracture: Secondary | ICD-10-CM

## 2020-09-12 DIAGNOSIS — F17210 Nicotine dependence, cigarettes, uncomplicated: Secondary | ICD-10-CM | POA: Insufficient documentation

## 2020-09-12 DIAGNOSIS — S82842A Displaced bimalleolar fracture of left lower leg, initial encounter for closed fracture: Secondary | ICD-10-CM | POA: Insufficient documentation

## 2020-09-12 DIAGNOSIS — X501XXA Overexertion from prolonged static or awkward postures, initial encounter: Secondary | ICD-10-CM | POA: Insufficient documentation

## 2020-09-12 HISTORY — PX: ORIF ANKLE FRACTURE: SHX5408

## 2020-09-12 SURGERY — OPEN REDUCTION INTERNAL FIXATION (ORIF) ANKLE FRACTURE
Anesthesia: General | Site: Ankle | Laterality: Left

## 2020-09-12 MED ORDER — FENTANYL CITRATE (PF) 100 MCG/2ML IJ SOLN
INTRAMUSCULAR | Status: AC
  Filled 2020-09-12: qty 2

## 2020-09-12 MED ORDER — OXYCODONE HCL 5 MG PO TABS: 5.0000 mg | ORAL_TABLET | ORAL | 0 refills | Status: AC | PRN

## 2020-09-12 MED ORDER — CHLORHEXIDINE GLUCONATE 0.12 % MT SOLN
15.0000 mL | Freq: Once | OROMUCOSAL | Status: AC
  Administered 2020-09-12: 15 mL via OROMUCOSAL

## 2020-09-12 MED ORDER — FENTANYL CITRATE (PF) 100 MCG/2ML IJ SOLN
INTRAMUSCULAR | Status: DC | PRN
  Administered 2020-09-12 (×2): 50 ug via INTRAVENOUS

## 2020-09-12 MED ORDER — ROPIVACAINE HCL 5 MG/ML IJ SOLN
INTRAMUSCULAR | Status: DC | PRN
  Administered 2020-09-12 (×2): 15 mL via EPIDURAL

## 2020-09-12 MED ORDER — ONDANSETRON HCL 4 MG PO TABS: 4.0000 mg | ORAL_TABLET | Freq: Three times a day (TID) | ORAL | 0 refills | Status: AC | PRN

## 2020-09-12 MED ORDER — PROPOFOL 10 MG/ML IV BOLUS
INTRAVENOUS | Status: AC
  Filled 2020-09-12: qty 20

## 2020-09-12 MED ORDER — ONDANSETRON HCL 4 MG/2ML IJ SOLN: 4.0000 mg | Freq: Once | INTRAMUSCULAR | Status: DC | PRN

## 2020-09-12 MED ORDER — DEXAMETHASONE SODIUM PHOSPHATE 10 MG/ML IJ SOLN
INTRAMUSCULAR | Status: AC
  Filled 2020-09-12: qty 1

## 2020-09-12 MED ORDER — MELOXICAM 15 MG PO TABS: 15.0000 mg | ORAL_TABLET | Freq: Every day | ORAL | 0 refills | Status: AC

## 2020-09-12 MED ORDER — MIDAZOLAM HCL 2 MG/2ML IJ SOLN
INTRAMUSCULAR | Status: AC
  Filled 2020-09-12: qty 2

## 2020-09-12 MED ORDER — ONDANSETRON HCL 4 MG/2ML IJ SOLN
INTRAMUSCULAR | Status: DC | PRN
  Administered 2020-09-12: 4 mg via INTRAVENOUS

## 2020-09-12 MED ORDER — LIDOCAINE HCL (PF) 1 % IJ SOLN
INTRAMUSCULAR | Status: AC
  Filled 2020-09-12: qty 30

## 2020-09-12 MED ORDER — ASPIRIN EC 81 MG PO TBEC: 81.0000 mg | DELAYED_RELEASE_TABLET | Freq: Two times a day (BID) | ORAL | 0 refills | Status: AC

## 2020-09-12 MED ORDER — BUPIVACAINE-EPINEPHRINE (PF) 0.5% -1:200000 IJ SOLN
INTRAMUSCULAR | Status: DC | PRN
  Administered 2020-09-12: 24 mL via PERINEURAL

## 2020-09-12 MED ORDER — ORAL CARE MOUTH RINSE: 15.0000 mL | Freq: Once | OROMUCOSAL | Status: AC

## 2020-09-12 MED ORDER — FENTANYL CITRATE (PF) 100 MCG/2ML IJ SOLN: 25.0000 ug | INTRAMUSCULAR | Status: DC | PRN

## 2020-09-12 MED ORDER — ONDANSETRON HCL 4 MG/2ML IJ SOLN
INTRAMUSCULAR | Status: AC
  Filled 2020-09-12: qty 2

## 2020-09-12 MED ORDER — PROPOFOL 10 MG/ML IV BOLUS
INTRAVENOUS | Status: DC | PRN
  Administered 2020-09-12: 50 mg via INTRAVENOUS
  Administered 2020-09-12: 200 mg via INTRAVENOUS

## 2020-09-12 MED ORDER — MIDAZOLAM HCL 5 MG/5ML IJ SOLN
INTRAMUSCULAR | Status: DC | PRN
  Administered 2020-09-12: 2 mg via INTRAVENOUS

## 2020-09-12 MED ORDER — DEXAMETHASONE SODIUM PHOSPHATE 10 MG/ML IJ SOLN
INTRAMUSCULAR | Status: DC | PRN
  Administered 2020-09-12: 10 mg via INTRAVENOUS

## 2020-09-12 MED ORDER — ROPIVACAINE HCL 5 MG/ML IJ SOLN
INTRAMUSCULAR | Status: AC
  Filled 2020-09-12: qty 30

## 2020-09-12 MED ORDER — LIDOCAINE HCL 1 % IJ SOLN
INTRAMUSCULAR | Status: DC | PRN
  Administered 2020-09-12 (×2): 5 mL via INTRADERMAL

## 2020-09-12 MED ORDER — ACETAMINOPHEN 500 MG PO TABS: 1000.0000 mg | ORAL_TABLET | Freq: Three times a day (TID) | ORAL | 0 refills | Status: AC

## 2020-09-12 MED ORDER — CEFAZOLIN SODIUM-DEXTROSE 2-4 GM/100ML-% IV SOLN
2.0000 g | INTRAVENOUS | Status: AC
  Administered 2020-09-12: 2 g via INTRAVENOUS
  Filled 2020-09-12: qty 100

## 2020-09-12 MED ORDER — 0.9 % SODIUM CHLORIDE (POUR BTL) OPTIME
TOPICAL | Status: DC | PRN
  Administered 2020-09-12: 1000 mL

## 2020-09-12 MED ORDER — BUPIVACAINE-EPINEPHRINE (PF) 0.5% -1:200000 IJ SOLN
INTRAMUSCULAR | Status: AC
  Filled 2020-09-12: qty 30

## 2020-09-12 MED ORDER — LACTATED RINGERS IV SOLN: INTRAVENOUS | Status: DC

## 2020-09-12 SURGICAL SUPPLY — 62 items
APL PRP STRL LF DISP 70% ISPRP (MISCELLANEOUS) ×1
BANDAGE ESMARK 4X12 BL STRL LF (DISPOSABLE) ×1 IMPLANT
BIT DRILL 2 CANN GRADUATED (BIT) ×3 IMPLANT
BIT DRILL 2.5 CANN LNG (BIT) ×3 IMPLANT
BLADE SURG SZ10 CARB STEEL (BLADE) ×3 IMPLANT
BNDG CMPR 12X4 ELC STRL LF (DISPOSABLE) ×1
BNDG CMPR STD VLCR NS LF 5.8X4 (GAUZE/BANDAGES/DRESSINGS) ×2
BNDG COHESIVE 4X5 TAN STRL (GAUZE/BANDAGES/DRESSINGS) ×3 IMPLANT
BNDG ELASTIC 4X5.8 VLCR NS LF (GAUZE/BANDAGES/DRESSINGS) ×6 IMPLANT
BNDG ESMARK 4X12 BLUE STRL LF (DISPOSABLE) ×3
CHLORAPREP W/TINT 26 (MISCELLANEOUS) ×3 IMPLANT
CLOTH BEACON ORANGE TIMEOUT ST (SAFETY) ×3 IMPLANT
COVER LIGHT HANDLE STERIS (MISCELLANEOUS) ×6 IMPLANT
COVER MAYO STAND XLG (MISCELLANEOUS) ×6 IMPLANT
CUFF TOURN SGL QUICK 34 (TOURNIQUET CUFF) ×3
CUFF TRNQT CYL 34X4.125X (TOURNIQUET CUFF) ×1 IMPLANT
DECANTER SPIKE VIAL GLASS SM (MISCELLANEOUS) ×3 IMPLANT
DRAPE C-ARM FOLDED MOBILE STRL (DRAPES) ×3 IMPLANT
DRAPE C-ARMOR (DRAPES) ×3 IMPLANT
DRAPE U-SHAPE 47X51 STRL (DRAPES) ×3 IMPLANT
ELECT REM PT RETURN 9FT ADLT (ELECTROSURGICAL) ×3
ELECTRODE REM PT RTRN 9FT ADLT (ELECTROSURGICAL) ×1 IMPLANT
GAUZE SPONGE 4X4 12PLY STRL (GAUZE/BANDAGES/DRESSINGS) ×3 IMPLANT
GAUZE XEROFORM 1X8 LF (GAUZE/BANDAGES/DRESSINGS) ×3 IMPLANT
GLOVE SKINSENSE NS SZ8.0 LF (GLOVE) ×4
GLOVE SKINSENSE STRL SZ8.0 LF (GLOVE) ×2 IMPLANT
GLOVE SRG 8 PF TXTR STRL LF DI (GLOVE) ×1 IMPLANT
GLOVE SURG UNDER POLY LF SZ7 (GLOVE) ×9 IMPLANT
GLOVE SURG UNDER POLY LF SZ8 (GLOVE) ×3
GOWN STRL REUS W/ TWL XL LVL3 (GOWN DISPOSABLE) ×1 IMPLANT
GOWN STRL REUS W/TWL LRG LVL3 (GOWN DISPOSABLE) ×6 IMPLANT
GOWN STRL REUS W/TWL XL LVL3 (GOWN DISPOSABLE) ×3
INST SET MINOR BONE (KITS) ×3 IMPLANT
K-WIRE BB-TAK (WIRE) ×6
KIT TURNOVER KIT A (KITS) ×3 IMPLANT
KWIRE BB-TAK (WIRE) ×2 IMPLANT
MANIFOLD NEPTUNE II (INSTRUMENTS) ×3 IMPLANT
NEEDLE HYPO 21X1.5 SAFETY (NEEDLE) ×3 IMPLANT
NS IRRIG 1000ML POUR BTL (IV SOLUTION) ×3 IMPLANT
PACK BASIC LIMB (CUSTOM PROCEDURE TRAY) ×3 IMPLANT
PAD ABD 5X9 TENDERSORB (GAUZE/BANDAGES/DRESSINGS) ×12 IMPLANT
PAD ARMBOARD 7.5X6 YLW CONV (MISCELLANEOUS) ×3 IMPLANT
PADDING CAST ABS 4INX4YD NS (CAST SUPPLIES) ×4
PADDING CAST ABS COTTON 4X4 ST (CAST SUPPLIES) ×2 IMPLANT
PENCIL SMOKE EVACUATOR (MISCELLANEOUS) ×3 IMPLANT
PLATE DISTAL FIBULA 4H LOCKING (Plate) ×3 IMPLANT
SCREW CANCELLOUS 3MM 3X14MM (Screw) ×3 IMPLANT
SCREW CANCELLOUS 3X16MM (Screw) ×3 IMPLANT
SCREW CANCELLOUS 3X20 (Screw) ×3 IMPLANT
SCREW LOCKING 2.7X14MM (Screw) ×3 IMPLANT
SCREW LOCKING 2.7X16MM (Screw) ×6 IMPLANT
SCREW LOW PROFILE 3.5X14 (Screw) ×6 IMPLANT
SCREW LOW PROFILE 3.5X16 (Screw) ×3 IMPLANT
SCREW NLOCK T15 FT 18X3.5XST (Screw) ×1 IMPLANT
SCREW NON LOCK 3.5X18MM (Screw) ×3 IMPLANT
SET BASIN LINEN APH (SET/KITS/TRAYS/PACK) ×3 IMPLANT
SPLINT PLASTER CAST XFAST 5X30 (CAST SUPPLIES) ×1 IMPLANT
SPLINT PLASTER XFAST SET 5X30 (CAST SUPPLIES) ×2
SPONGE T-LAP 18X18 ~~LOC~~+RFID (SPONGE) ×3 IMPLANT
SUT ETHILON 3 0 FSL (SUTURE) ×6 IMPLANT
SYR 30ML LL (SYRINGE) ×3 IMPLANT
SYR BULB IRRIG 60ML STRL (SYRINGE) ×6 IMPLANT

## 2020-09-12 NOTE — Anesthesia Procedure Notes (Signed)
Anesthesia Regional Block: Popliteal block   Pre-Anesthetic Checklist: , timeout performed,  Correct Patient, Correct Site, Correct Laterality,  Correct Procedure, Correct Position, site marked,  Risks and benefits discussed,  Surgical consent,  Pre-op evaluation,  At surgeon's request and post-op pain management  Laterality: Left  Prep: chloraprep       Needles:  Injection technique: Single-shot  Needle Type: Stimiplex     Needle Length: 15cm  Needle Gauge: 20     Additional Needles:   Procedures:,,,, ultrasound used (permanent image in chart),,    Narrative:  Start time: 09/13/2019 7:11 AM End time: 09/13/2019 7:14 AM Injection made incrementally with aspirations every 3 mL.  Performed by: With CRNAs  Anesthesiologist: Windell Norfolk, MD CRNA: Brynda Peon, CRNA

## 2020-09-12 NOTE — Progress Notes (Signed)
RN  instructed the use of incentive spirometer and pt demonstrated correct technique.

## 2020-09-12 NOTE — Anesthesia Procedure Notes (Signed)
Procedure Name: LMA Insertion Date/Time: 09/12/2020 7:49 AM Performed by: Brynda Peon, CRNA Pre-anesthesia Checklist: Patient identified, Emergency Drugs available, Suction available, Patient being monitored and Timeout performed Patient Re-evaluated:Patient Re-evaluated prior to induction Oxygen Delivery Method: Circle system utilized Preoxygenation: Pre-oxygenation with 100% oxygen LMA: LMA inserted LMA Size: 5.0 Number of attempts: 1 Placement Confirmation: positive ETCO2, CO2 detector and breath sounds checked- equal and bilateral Tube secured with: Tape Dental Injury: Teeth and Oropharynx as per pre-operative assessment

## 2020-09-12 NOTE — Discharge Instructions (Addendum)
Mark A. Amedeo Kinsman, MD [facility] [address] [place],  [place]  [address] Phone: [phone] Fax: [phone]   [place] Please keep splint clean dry and intact until followup.  You may shower on Post-Op Day #2.  You must keep splint dry during this process and may find that a plastic bag taped around the leg or alternatively a towel based bath may be a better option.   If you get your splint wet or if it is damaged please contact our clinic.  EXERCISES Due to your splint being in place you will not be able to bear weight through your extremity.   DO NOT PUT ANY WEIGHT ON YOUR OPERATIVE LEG Please use crutches or a walker to avoid weight bearing.   REGIONAL ANESTHESIA (NERVE BLOCKS) The anesthesia team may have performed a nerve block for you if safe in the setting of your care.  This is a great tool used to minimize pain.  Typically the block may start wearing off overnight but the long acting medicine may last for 3-4 days.  The nerve block wearing off can be a challenging period but please utilize your as needed pain medications to try and manage this period.    POST-OP MEDICATIONS- Multimodal approach to pain control  In general your pain will be controlled with a combination of substances.  Prescriptions unless otherwise discussed are electronically sent to your pharmacy.  This is a carefully made plan we use to minimize narcotic use.     - Meloxicam - Anti-inflammatory medication taken on a scheduled basis  - Acetaminophen - Non-narcotic pain medicine taken on a scheduled basis   - Oxycodone - This is a strong narcotic, to be used only on an "as needed" basis for pain.  -  Aspirin 81mg  - This medicine is used to minimize the risk of blood clots after surgery.             -          Zofran - take as needed for nausea   FOLLOW-UP If you develop a Fever (>101.5), Redness or Drainage from the  surgical incision site, please call our office to arrange for an evaluation. Please call the office to schedule a follow-up appointment for your incision check if you do not already have one, 10-14 days post-operatively.  IF YOU HAVE ANY QUESTIONS, PLEASE FEEL FREE TO CALL OUR OFFICE.  HELPFUL INFORMATION  If you had a block, it will wear off between 8-24 hrs postop typically.  This is period when your pain may go from nearly zero to the pain you would have had postop without the block.  This is an abrupt transition but nothing dangerous is happening.  You may take an extra dose of narcotic when this happens.  You should wean off your narcotic medicines as soon as you are able.  Most patients will be off or using minimal narcotics before their first postop appointment.   Elevating your leg will help with swelling and pain control.  You are encouraged to elevate your leg as much as possible in the first couple of weeks following surgery.  Imagine a drop of water on your toe, and your goal is to get that water back to your heart.  We suggest you use the pain medication the first night prior to going to bed, in order to ease any pain when the anesthesia wears off. You should  avoid taking pain medications on an empty stomach as it will make you nauseous.  Do not drink alcoholic beverages or take illicit drugs when taking pain medications.  In most states it is against the law to drive while you are in a splint or sling.  And certainly against the law to drive while taking narcotics.  You may return to work/school in the next couple of days when you feel up to it.   Pain medication may make you constipated.  Below are a few solutions to try in this order: Decrease the amount of pain medication if you aren't having pain. Drink lots of decaffeinated fluids. Drink prune juice and/or each dried prunes  If the first 3 don't work start with additional solutions Take Colace - an over-the-counter stool  softener Take Senokot - an over-the-counter laxative Take Miralax - a stronger over-the-counter laxative

## 2020-09-12 NOTE — Anesthesia Preprocedure Evaluation (Signed)
Anesthesia Evaluation  Patient identified by MRN, date of birth, ID band Patient awake    Reviewed: Allergy & Precautions, H&P , NPO status , Patient's Chart, lab work & pertinent test results, reviewed documented beta blocker date and time   Airway Mallampati: II  TM Distance: >3 FB Neck ROM: full    Dental no notable dental hx. (+) Teeth Intact   Pulmonary neg pulmonary ROS, Current Smoker,    Pulmonary exam normal breath sounds clear to auscultation       Cardiovascular Exercise Tolerance: Good negative cardio ROS   Rhythm:regular Rate:Normal     Neuro/Psych negative neurological ROS  negative psych ROS   GI/Hepatic negative GI ROS, Neg liver ROS,   Endo/Other  negative endocrine ROS  Renal/GU negative Renal ROS  negative genitourinary   Musculoskeletal   Abdominal   Peds  Hematology negative hematology ROS (+)   Anesthesia Other Findings   Reproductive/Obstetrics negative OB ROS                             Anesthesia Physical Anesthesia Plan  ASA: 1  Anesthesia Plan: General   Post-op Pain Management:  Regional for Post-op pain and GA combined w/ Regional for post-op pain   Induction:   PONV Risk Score and Plan: Ondansetron  Airway Management Planned:   Additional Equipment:   Intra-op Plan:   Post-operative Plan:   Informed Consent: I have reviewed the patients History and Physical, chart, labs and discussed the procedure including the risks, benefits and alternatives for the proposed anesthesia with the patient or authorized representative who has indicated his/her understanding and acceptance.     Dental Advisory Given  Plan Discussed with: CRNA  Anesthesia Plan Comments:         Anesthesia Quick Evaluation

## 2020-09-12 NOTE — Anesthesia Postprocedure Evaluation (Signed)
Anesthesia Post Note  Patient: AMI THORNSBERRY  Procedure(s) Performed: OPEN REDUCTION INTERNAL FIXATION (ORIF) ANKLE FRACTURE (Left: Ankle)  Patient location during evaluation: Phase II Anesthesia Type: General Level of consciousness: awake Pain management: pain level controlled Vital Signs Assessment: post-procedure vital signs reviewed and stable Respiratory status: spontaneous breathing and respiratory function stable Cardiovascular status: blood pressure returned to baseline and stable Postop Assessment: no headache and no apparent nausea or vomiting Anesthetic complications: no Comments: Late entry   No notable events documented.   Last Vitals:  Vitals:   09/12/20 1030 09/12/20 1044  BP:  (!) 138/99  Pulse: 81 72  Resp: 14   Temp:  36.5 C  SpO2: 99% 98%    Last Pain:  Vitals:   09/12/20 1044  TempSrc: Oral  PainSc: 0-No pain                 Windell Norfolk

## 2020-09-12 NOTE — Op Note (Signed)
Orthopaedic Surgery Operative Note (CSN: [id number])  Shawn Poole  Jul 20, 1975 Date of Surgery: 09/12/2020   Diagnoses:  Left ankle fracture  Procedure: ORIF left bimalleolar ankle fracture   Operative Finding Successful completion of the planned procedure.  Plate on distal left fibula.  Ankle mortise stable without fixation of small, medial malleolus avulsion fracture.  No fixation medially.      Post-Op Diagnosis: Same Surgeons:Primary: Oliver Barre, MD Assistants: Cecile Sheerer Location: [facility] OR ROOM 4 Anesthesia: General with regional anesthesia Antibiotics: Ancef 2 g Tourniquet time:  Total Tourniquet Time Documented: Thigh (Left) - 57 minutes Total: Thigh (Left) - 57 minutes  Estimated Blood Loss: Minimal Complications: None Specimens: None Implants: Implant Name Type Inv. Item Serial No. Manufacturer Lot No. LRB No. Used Action  Arthrex 4 Hole Plate    [employer] STERILE ON SET Left 1 Implanted  [employer] 3.0 X 20 MM SCREW    [employer] STERILE ON SET Left 1 Implanted  [employer] 2.7 X 16 MM LOCKING SCREW    [employer] STERILE ON SET Left 2 Implanted  AR[employer] 2.7 X 14 MM LOCKING SCREW    [employer] STERILE ON SET Left 1 Implanted  AR[employer] 3.5 X 14 MM SCREW    [employer] STERILE ON SET Left 2 Implanted  ARTH[employer]X 3.5 X 16 MM SCREW    [employer] STERILE ON SET Left 1 Implanted    Indications for Surgery:   Shawn Poole is a 45 y.o. male who sustained a bimalleolar left ankle fracture after an inversion injury.  Based on the appearance of his radiographs, and apparent instability of the ankle I recommended operative fixation to restore form and function.  Benefits and risks of operative and nonoperative management were discussed prior to surgery with the assistance of a spanish interpreter and informed consent form was completed.  Specific risks including infection, need for additional surgery, bleeding, poor healing, nonunion, malunion, persistent pain and  more severe risks associated with anesthesia were discussed.  He elected to proceed.    Procedure:   The patient was identified properly. Informed consent was obtained and the surgical site was marked. The patient was taken to the OR where general anesthesia was induced.  The patient was positioned supine.  The left leg was prepped and draped in the usual sterile fashion.  Timeout was performed before the beginning of the case.  Tourniquet was used for the above duration.  He received Ancef 2 g prior to making the incision.   Using fluoroscopy, the fracture was identified and marked on the skin.  We made an incision centered over the fracture site, extending distally to the tip of the fibula.  Skin was incised sharply and then we used blunt dissection through subcutaneous tissue to ensure that the superficial peroneal nerve was not present.  We carried the dissection to the lateral border of the fibula.  The fracture site was identified.  Fracture callus removed with a rongeur and irrigation.  We were able to easily reduce the fracture, and this was confirmed using fluoroscopy.  We then selected an appropriate sized plate, and confirmed placement using fluoroscopy.  The plate was then provisionally secured to the lateral fibula.  The fracture was slightly oblique, but not amenable to a lag screw, so the plate was used to reduce the fracture.  A single bicortical screw was placed proximal to the fracture and the plate helped to reduce the lateral displacement of the fracture.  Fluoroscopy confirmed  an excellent reduction.  We then placed a fracture reduction clamp to maintain the reduction. Next we placed a bicortical screw in the distal cluster, followed by multiple locking screws.  Finally, we placed 2 additional bicortical screws proximal to the fracture.  Fluoroscopy confirmed placement of the screws and an excellent reduction.    We then stressed the ankle under fluoroscopy and there was no longer  medial clear space widening.  In addition, when the ankle was placed in a plantigrade position, the medial malleolus avulsion fragment demonstrated improved alignment.  At this point, I deemed that no additional fixation was necessary on the medial ankle.    We irrigated the wound copiously.  We closed the incision with multiple 3-0 horizontal mattress sutures.  Additional local anesthetic was injected around the incision. Sterile dressing was placed, followed by a well padded splint.  The patient was awoken taken to PACU in stable condition.   Post-operative plan:  The patient will be discharged home once he has recovered from anesthesia. He will be non weightbearing on the left leg   DVT prophylaxis Aspirin 81 mg twice daily for 6 weeks.    Pain control with PRN pain medication preferring oral medicines.   Follow up plan will be scheduled in approximately 10-14 days for incision check and XR.

## 2020-09-12 NOTE — Transfer of Care (Signed)
Immediate Anesthesia Transfer of Care Note  Patient: Shawn Poole  Procedure(s) Performed: OPEN REDUCTION INTERNAL FIXATION (ORIF) ANKLE FRACTURE (Left: Ankle)  Patient Location: PACU  Anesthesia Type:General and Regional  Level of Consciousness: awake, alert , oriented and patient cooperative  Airway & Oxygen Therapy: Patient Spontanous Breathing and Patient connected to nasal cannula oxygen  Post-op Assessment: Report given to RN, Post -op Vital signs reviewed and stable and Patient moving all extremities  Post vital signs: Reviewed and stable  Last Vitals:  Vitals Value Taken Time  BP 129/97 09/12/20 0925  Temp    Pulse 86 09/12/20 0926  Resp 15 09/12/20 0926  SpO2 100 % 09/12/20 0926  Vitals shown include unvalidated device data.  Last Pain:  Vitals:   09/12/20 0706  TempSrc: Oral  PainSc: 0-No pain      Patients Stated Pain Goal: 6 (09/12/20 0706)  Complications: No notable events documented.

## 2020-09-12 NOTE — Progress Notes (Signed)
Reviewed all of Dr. Dallas Schimke specific instructions with patient using the video interpreter. Patient verbalized understanding.  Also reviewed instructions with son who is [place] speaking and states he can read [place] .

## 2020-09-12 NOTE — Anesthesia Procedure Notes (Signed)
Anesthesia Regional Block: Adductor canal block   Pre-Anesthetic Checklist: , timeout performed,  Correct Patient, Correct Site, Correct Laterality,  Correct Procedure, Correct Position, site marked,  Risks and benefits discussed,  Surgical consent,  Pre-op evaluation,  At surgeon's request and post-op pain management  Laterality: Left  Prep: chloraprep       Needles:  Injection technique: Single-shot  Needle Type: Stimiplex     Needle Length: 15cm  Needle Gauge: 20     Additional Needles:   Procedures:,,,, ultrasound used (permanent image in chart),,    Narrative:  Start time: 09/12/2020 7:15 AM End time: 09/12/2020 7:18 AM Injection made incrementally with aspirations every 3 mL.  Performed by: With CRNAs  Anesthesiologist: Windell Norfolk, MD CRNA: Brynda Peon, CRNA

## 2020-09-12 NOTE — Interval H&P Note (Signed)
History and Physical Interval Note:  09/12/2020 7:07 AM  Shawn Poole  has presented today for surgery, with the diagnosis of Left ankle fracture.  The various methods of treatment have been discussed with the patient and family. After consideration of risks, benefits and other options for treatment, the patient has consented to  Procedure(s): OPEN REDUCTION INTERNAL FIXATION (ORIF) ANKLE FRACTURE (Left) as a surgical intervention.  The patient's history has been reviewed, patient examined, no change in status, stable for surgery.  I have reviewed the patient's chart and labs.  Questions were answered to the patient's satisfaction.     Shawn Poole

## 2020-09-17 ENCOUNTER — Encounter (HOSPITAL_COMMUNITY): Payer: Self-pay | Admitting: Orthopedic Surgery

## 2020-09-27 ENCOUNTER — Ambulatory Visit (INDEPENDENT_AMBULATORY_CARE_PROVIDER_SITE_OTHER): Payer: Self-pay | Admitting: Orthopedic Surgery

## 2020-09-27 ENCOUNTER — Other Ambulatory Visit: Payer: Self-pay

## 2020-09-27 ENCOUNTER — Ambulatory Visit: Payer: Self-pay

## 2020-09-27 ENCOUNTER — Encounter: Payer: Self-pay | Admitting: Orthopedic Surgery

## 2020-09-27 VITALS — Ht 72.0 in | Wt 208.0 lb

## 2020-09-27 DIAGNOSIS — S82892D Other fracture of left lower leg, subsequent encounter for closed fracture with routine healing: Secondary | ICD-10-CM

## 2020-09-27 NOTE — Progress Notes (Signed)
Orthopaedic Postop Note  Assessment: Shawn Poole is a 45 y.o. male s/p ORIF of Left ankle fracture  DOS: 09/12/2020  Plan: Sutures removed, steri strips placed Well padded short leg cast placed in clinic today NWB on the operative extremity Continue to take Aspirin 81 mg BID  Cast application - Left short leg cast   Verbal consent was obtained and the correct extremity was identified. A well padded, appropriately molded short leg cast was applied to the left leg Toes remained warm and well perfused.   There were no sharp edges Patient tolerated the procedure well Cast care instructions were provided     Follow-up: Return in about 2 weeks (around 10/11/2020). XR at next visit: Left ankle  Subjective:  Chief Complaint  Patient presents with   1st postop visit, L ankle ORIF 09/12/2020     History of Present Illness: Shawn Poole is a 45 y.o. male who presents following the above stated procedure.  He is 2 weeks postop and doing well.  He has obviously been walking on his left leg. Pain is controlled.  No fevers or chills.  No complaints at this time.   Review of Systems: No fevers or chills No numbness or tingling No Chest Pain No shortness of breath   Objective: Ht 6' (1.829 m)   Wt 208 lb (94.3 kg)   BMI 28.21 kg/m   Physical Exam:  Alert and oriented.  No acute distress  Splint was tattered.  ACE wrap falling apart.  Splint is dirty.   Incision is healing well.  No surrounding erythema or drainage.  Minimal tenderness to palpation medial and lateral ankle.  Active dorsiflexion of the great toe and ankle.  Mild swelling over the foot and ankle.  Sensation is intact over the dorsum of the foot.  Toes are warm and well perfused.   IMAGING: I personally ordered and reviewed the following images  X-ray of the left ankle was obtained in clinic today and demonstrates no acute injury.  There has been no evidence of hardware failure or loosening.   The distal fibula fracture remains in good alignment.  All screws remain intact.  There is a small avulsion fracture off the medial malleolus.  The mortise remains intact.  There is no syndesmotic disruption.  Impression: Healing left distal fibula fracture in good alignment following ORIF   Oliver Barre, MD 09/27/2020 11:12 AM  \

## 2020-09-27 NOTE — Patient Instructions (Signed)
NO WEIGHT BEARING ON YOUR LEG.    General Cast Instructions  1.  You were placed in a cast in clinic today.  Please keep the cast material clean, dry and intact.  Please do not use anything to itch the under the cast.  If it gets itchy, you can consider taking benadryl, or similar medication.  If the cast material gets wet, place it on a towel and use a hair dryer on a low setting. 2.  Tylenol or Ibuprofen/Naproxen as needed.   3.  Recommend elevating your extremity as much as possible to help with swelling. 4.  F/u 2 weeks, cast off and repeat XR

## 2020-10-15 ENCOUNTER — Ambulatory Visit: Payer: Self-pay

## 2020-10-15 ENCOUNTER — Ambulatory Visit (INDEPENDENT_AMBULATORY_CARE_PROVIDER_SITE_OTHER): Payer: Self-pay | Admitting: Orthopedic Surgery

## 2020-10-15 ENCOUNTER — Encounter: Payer: Self-pay | Admitting: Orthopedic Surgery

## 2020-10-15 ENCOUNTER — Other Ambulatory Visit: Payer: Self-pay

## 2020-10-15 VITALS — Ht 72.0 in | Wt 208.0 lb

## 2020-10-15 DIAGNOSIS — S82892D Other fracture of left lower leg, subsequent encounter for closed fracture with routine healing: Secondary | ICD-10-CM

## 2020-10-15 NOTE — Patient Instructions (Addendum)
No weight bearing on your left ankle for the next 2 weeks  Ankle Exercises Ask your health care provider which exercises are safe for you. Do exercises exactly as told by your health care provider and adjust them as directed. It is normal to feel mild stretching, pulling, tightness, or mild discomfort as you do these exercises. Stop right away if you feel sudden pain or your pain gets worse. Do not begin these exercises until told by your health care provider.  Stretching and range-of-motion exercises These exercises warm up your muscles and joints and improve the movement and flexibility of your ankle. These exercises may also help to relieve pain.  Dorsiflexion/plantar flexion  Sit with your L knee straight or bent. Do not rest your foot on anything. Flex your left ankle to tilt the top of your foot toward your shin. This is called dorsiflexion. Hold this position for 5 seconds. Point your toes downward to tilt the top of your foot away from your shin. This is called plantar flexion. Hold this position for 5 seconds. Repeat 10 times. Complete this exercise 2-3 times a day.  As tolerated  Ankle alphabet  Sit with your L foot supported at your lower leg. Do not rest your foot on anything. Make sure your foot has room to move freely. Think of your L foot as a paintbrush: Move your foot to trace each letter of the alphabet in the air. Keep your hip and knee still while you trace the letters. Trace every letter from A to Z. Make the letters as large as you can without causing or increasing any discomfort.  Repeat 2-3 times. Complete this exercise 2-3 times a day.   Strengthening exercises These exercises build strength and endurance in your ankle. Endurance is the ability to use your muscles for a long time, even after they get tired. Dorsiflexors These are muscles that lift your foot up. Secure a rubber exercise band or tube to an object, such as a table leg, that will stay still when  the band is pulled. Secure the other end around your L foot. Sit on the floor, facing the object with your L leg extended. The band or tube should be slightly tense when your foot is relaxed. Slowly flex your L ankle and toes to bring your foot toward your shin. Hold this position for 5 seconds. Slowly return your foot to the starting position, controlling the band as you do that. Repeat 10 times. Complete this exercise 2-3 times a day.  Plantar flexors These are muscles that push your foot down. Sit on the floor with your L leg extended. Loop a rubber exercise band or tube around the ball of your L foot. The ball of your foot is on the walking surface, right under your toes. The band or tube should be slightly tense when your foot is relaxed. Slowly point your toes downward, pushing them away from you. Hold this position for 5 seconds. Slowly release the tension in the band or tube, controlling smoothly until your foot is back in the starting position. Repeat 10 times. Complete this exercise 2-3 times a day.  Towel curls  Sit in a chair on a non-carpeted surface, and put your feet on the floor. Place a towel in front of your feet. Keeping your heel on the floor, put your L foot on the towel. Pull the towel toward you by grabbing the towel with your toes and curling them under. Keep your heel on the floor.  Let your toes relax. Grab the towel again. Keep pulling the towel until it is completely underneath your foot. Repeat 10 times. Complete this exercise 2-3 times a day.  Standing plantar flexion This is an exercise in which you use your toes to lift your body's weight while standing. Stand with your feet shoulder-width apart. Keep your weight spread evenly over the width of your feet while you rise up on your toes. Use a wall or table to steady yourself if needed, but try not to use it for support. If this exercise is too easy, try these options: Shift your weight toward your L leg  until you feel challenged. If told by your health care provider, lift your uninjured leg off the floor. Hold this position for 5 seconds. Repeat 10 times. Complete this exercise 2-3 times a day.  Tandem walking Stand with one foot directly in front of the other. Slowly raise your back foot up, lifting your heel before your toes, and place it directly in front of your other foot. Continue to walk in this heel-to-toe way. Have a countertop or wall nearby to use if needed to keep your balance, but try not to hold onto anything for support.  Repeat 10 times. Complete this exercise 2-3 times a day.   Document Revised: 11/27/2017 Document Reviewed: 11/29/2017 Elsevier Patient Education  2020 [employer].

## 2020-10-16 ENCOUNTER — Encounter: Payer: Self-pay | Admitting: Orthopedic Surgery

## 2020-10-16 NOTE — Progress Notes (Signed)
Orthopaedic Postop Note  Assessment: Shawn Poole is a 45 y.o. male s/p ORIF of Left ankle fracture  DOS: 09/12/2020  Plan: Cast was removed in clinic today.  It is obvious that he has been ambulating on the cast.  He has no pain in the left ankle.  He is not taking any pain medications.  X-rays look good.  There is been no interval displacement of the fractures, there clearly is healing.  He is now 1 month out from surgery, and we will transition him to a walking boot.  With the assistance of a Engineer, structural, I have advised him to remain nonweightbearing on the left leg for an additional 2 weeks, after that time, he can progress his weightbearing status.  I was explicit in my instructions, and advised him that he is healing well, but he is not fully healed.  It is possible that if he pushes too hard, we may have some issues with his overall healing.  He stated his understanding.  I will see him in approximately 4 weeks for repeat evaluation.  I provided him with some general ankle sprain exercises that he can start immediately.    Follow-up: Return in about 4 weeks (around 11/12/2020). XR at next visit: Left ankle  Subjective:  Chief Complaint  Patient presents with   Routine Post Op    Lt ankle DOS 09/12/20. Pt states he has been walking on his cast due very little/no pain. There is also a hole at the heel of cast before removal.      History of Present Illness: Shawn Poole is a 45 y.o. male who presents following the above stated procedure.  Surgery was 4 weeks ago.  He has not been following the nonweightbearing restrictions.  Upon presentation to clinic today, it was noted that the cast was obviously dirty.  The bottom of the cast has specific breakdown, especially around the heel.  There is a small hole in the heel of the cast.  He reports he has no pain.  No fevers or chills.  He has been using crutches.  States he has not been putting a lot of weight on his  leg.  Review of Systems: No fevers or chills No numbness or tingling No Chest Pain No shortness of breath   Objective: Ht 6' (1.829 m)   Wt 208 lb (94.3 kg)   BMI 28.21 kg/m   Physical Exam:  Alert and oriented.  No acute distress  Splint had broken down.  A fully healed cast.  There was dirt and grass within the cast itself.  Upon removal of the cast, his surgical incision is healing well.  No surrounding erythema or drainage.  No tenderness palpation along the incision.  He tolerates dorsiflexion of the ankle, without pain.  Sensation is intact in the dorsum of his foot.  Sensation intact in the first webspace.  Active motion intact to the EHL and TA.  Toes are warm and well-perfused.  IMAGING: I personally ordered and reviewed the following images  X-ray of the left ankle was obtained in clinic and compared to previous x-rays.  There has been interval consolidation of the fracture site to the distal fibula.  Plate and screw construct remains intact.  No evidence of hardware failure.  The mortise remains intact.  There is no syndesmotic disruption.  The small medial malleolus avulsion fragment with slightly improved alignment overall.  Impression: Healing left distal fibula fracture.   Oliver Barre, MD 10/16/2020  7:45 AM  \

## 2020-11-12 ENCOUNTER — Encounter: Payer: Self-pay | Admitting: Orthopedic Surgery

## 2023-03-28 IMAGING — RF DG C-ARM 1-60 MIN
1 series · 4 of 4 positions shown · non-contrast
Comparison: September 12, 2020.

CLINICAL DATA: Left ankle ORIF.

EXAM:
DG C-ARM 1-60 MIN; LEFT ANKLE - 2 VIEW
FLUOROSCOPY TIME:  Fluoroscopy Time:  25 seconds.
Number of Acquired Spot Images: 4

[Series 1: run · 4 of 4 slices shown]
[im 1/4]
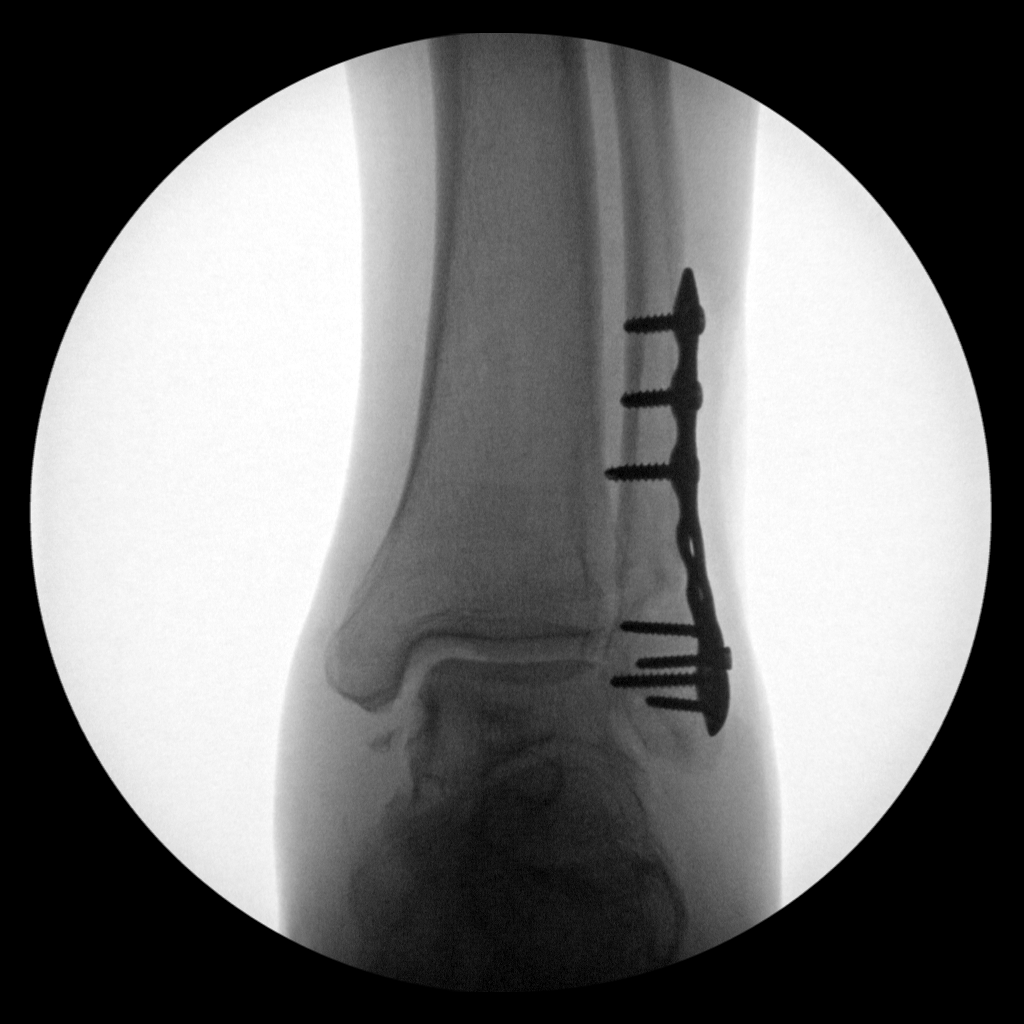
[im 2/4]
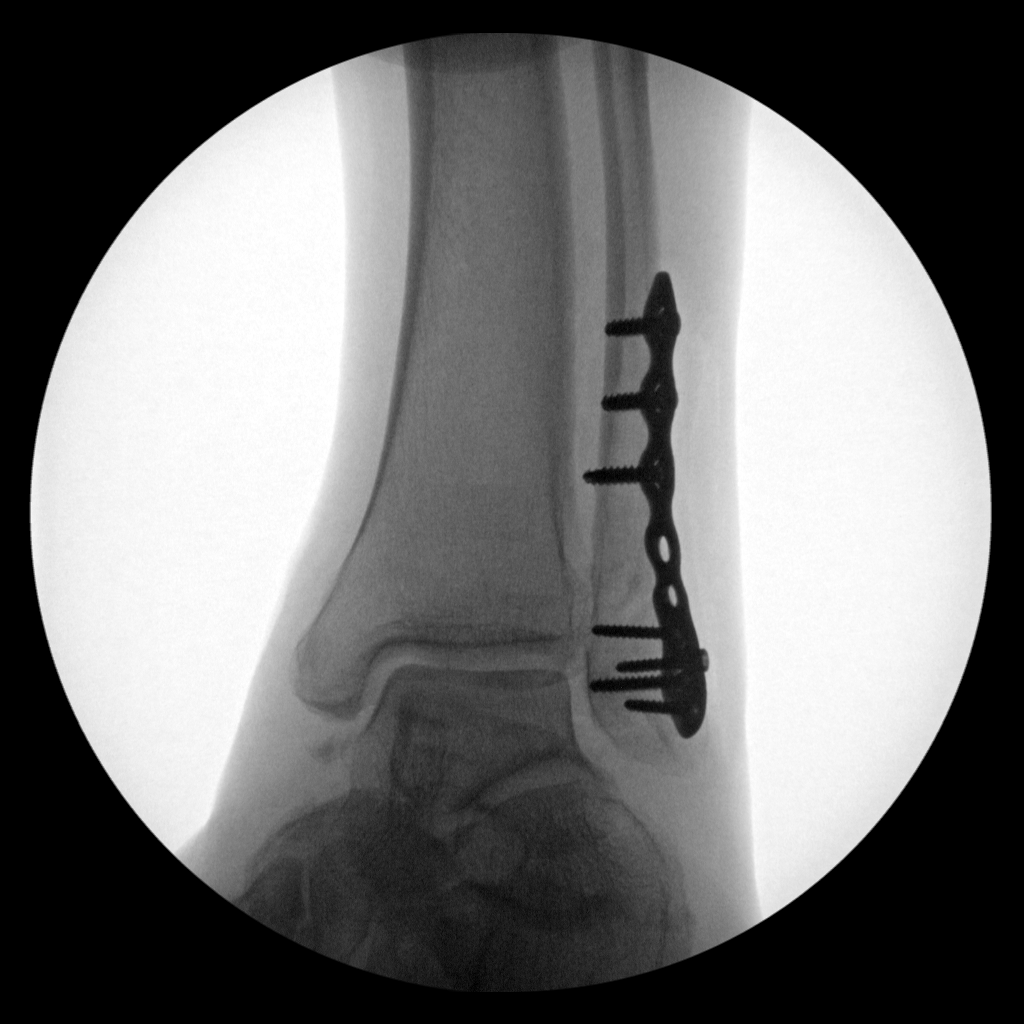
[im 3/4]
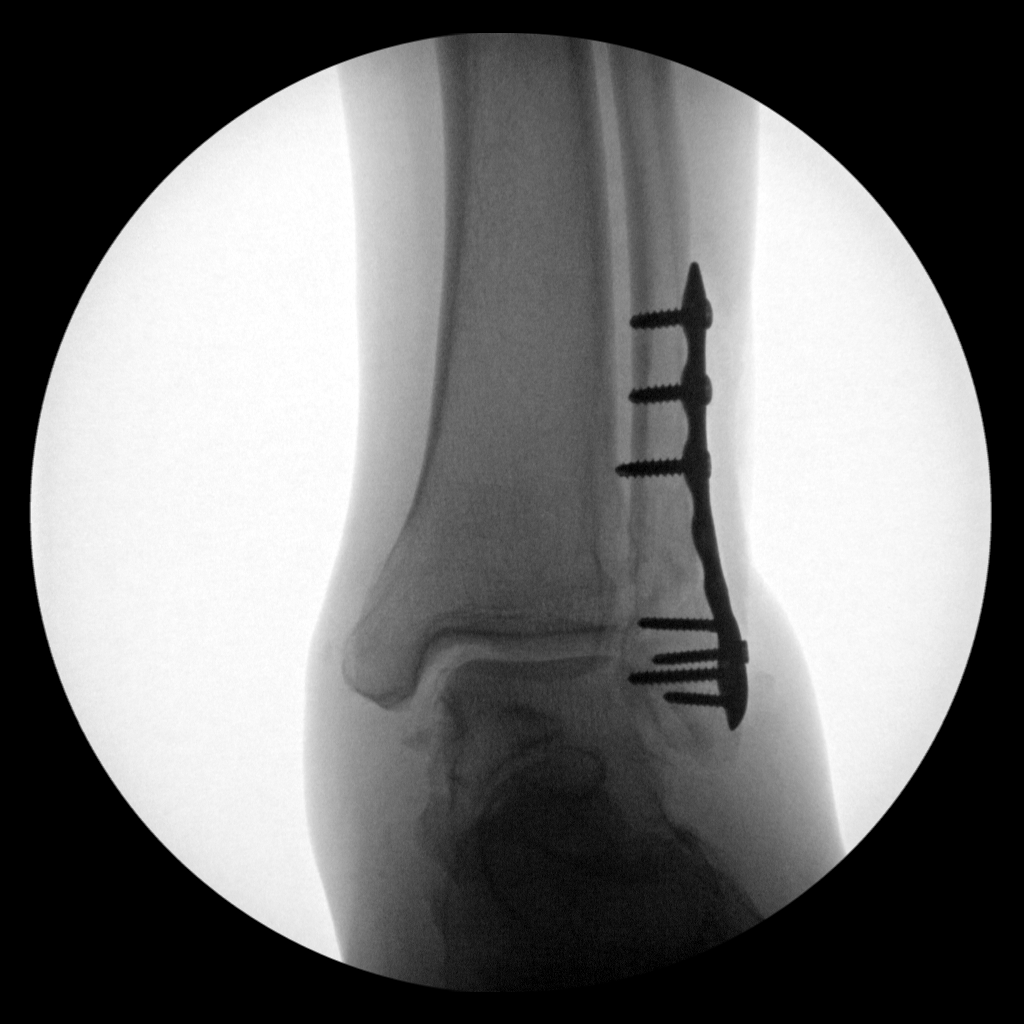
[im 4/4]
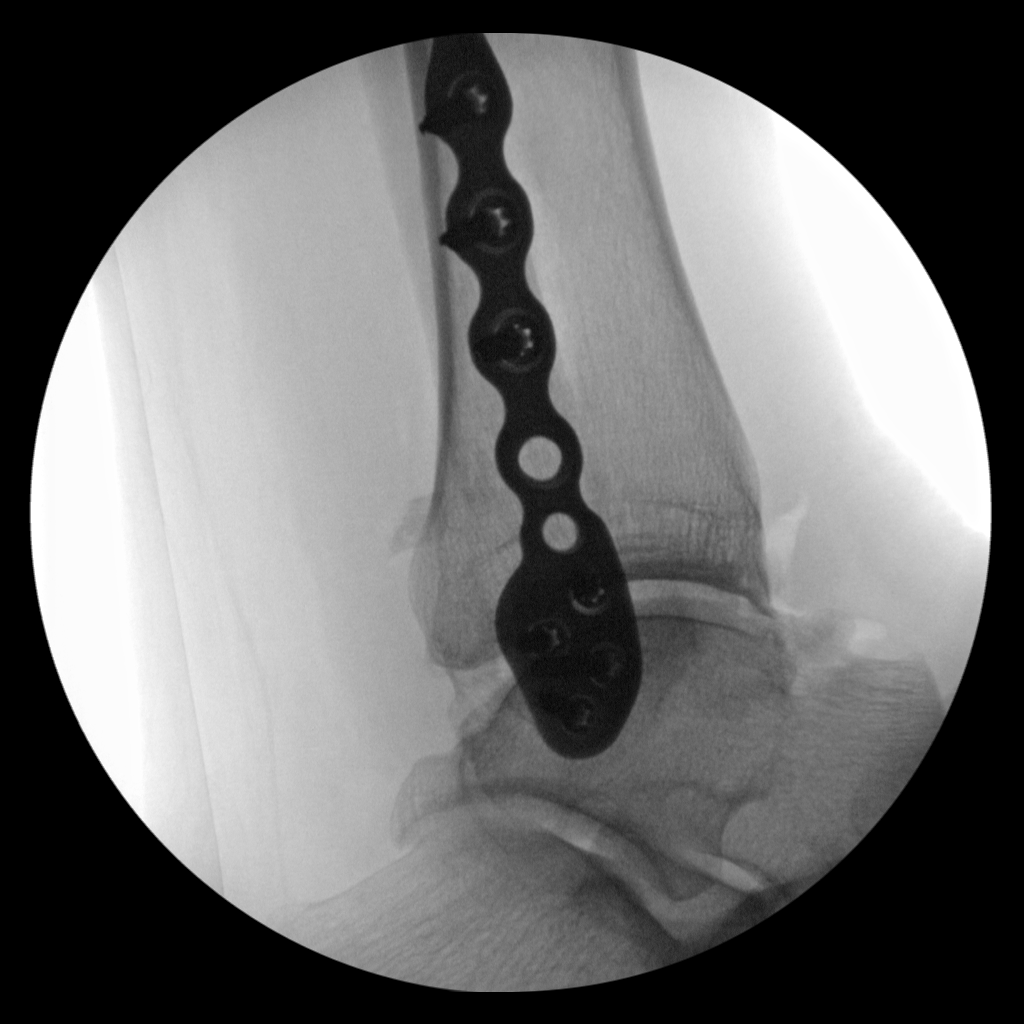

[4 of 4 positions shown; findings below may reference images not displayed]

FINDINGS: Four C-arm fluoroscopic images were obtained intraoperatively and
submitted for post operative interpretation. These images
demonstrate plate screw fixation of the distal fibular fracture with
improved alignment, near anatomic. No unexpected findings. Displaced
medial malleolar fracture was better characterized on prior
radiograph. Please see the performing provider's procedural report
for further detail.
IMPRESSION: Distal fibular plate and screw fixation.
